# Patient Record
Sex: Male | Born: 1960 | Race: White | Hispanic: No | Smoking: Never smoker
Health system: Southern US, Community
[De-identification: ages and names within clinical notes are randomized; demographics above are authoritative.]

## PROBLEM LIST (undated history)

## (undated) DIAGNOSIS — K469 Unspecified abdominal hernia without obstruction or gangrene: Secondary | ICD-10-CM

## (undated) HISTORY — PX: INGUINAL HERNIA REPAIR: SHX194

## (undated) HISTORY — DX: Unspecified abdominal hernia without obstruction or gangrene: K46.9

## (undated) HISTORY — PX: KNEE SURGERY: SHX244

---

## 2007-12-07 ENCOUNTER — Observation Stay (HOSPITAL_COMMUNITY): Admission: EM | Admit: 2007-12-07 | Discharge: 2007-12-10 | Payer: Self-pay | Admitting: Emergency Medicine

## 2010-05-27 ENCOUNTER — Emergency Department (HOSPITAL_COMMUNITY)
Admission: EM | Admit: 2010-05-27 | Discharge: 2010-05-27 | Payer: Self-pay | Source: Home / Self Care | Admitting: Emergency Medicine

## 2010-05-30 LAB — URINALYSIS, ROUTINE W REFLEX MICROSCOPIC
Bilirubin Urine: NEGATIVE
Ketones, ur: NEGATIVE mg/dL
Nitrite: NEGATIVE
Specific Gravity, Urine: 1.027 (ref 1.005–1.030)
Urobilinogen, UA: 0.2 mg/dL (ref 0.0–1.0)

## 2010-05-30 LAB — URINE MICROSCOPIC-ADD ON

## 2010-05-31 LAB — URINE CULTURE: Colony Count: 7000

## 2010-09-20 NOTE — Cardiovascular Report (Signed)
NAME:  Frank Spencer, Frank Spencer NO.:  [id number]   MEDICAL RECORD NO.:  [id number]          PATIENT TYPE:  INP   LOCATION:  2025                         FACILITY:  [facility]   PHYSICIAN:  Nanetta Batty, M.D.   DATE OF BIRTH:  1960-08-01   DATE OF PROCEDURE:  DATE OF DISCHARGE:                            CARDIAC CATHETERIZATION   Mr. 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  His risk factors include strong family history for  heart disease, but otherwise is negative.  He has had several days of  anginal sounding chest pain.  He was admitted, ruled out for myocardial  infarction with no EKG changes.  Because of his strong family history,  he presents now for diagnostic coronary arteriography to define his  anatomy and rule out ischemic etiology.   DESCRIPTION OF PROCEDURE:  The patient was brought to the second floor  of [facility] in the postabsorptive state.  He was  premedicated with p.o. Valium.  His right groin was prepped and shaved  in the usual sterile fashion.  Xylocaine 1% was used for local  anesthesia.  A 6-French sheath was inserted to the right femoral artery  using a standard Seldinger technique.  A 6-French right and left Judkins  diagnostic catheter as well as 6-French pigtail catheter and Amplatz  Right 1 catheters were used for selective coronary angiography and left  ventriculography respectively.  Visipaque dye was used for the entirety  of the case.  Retrograde aortic, left ventricular, and pullback  pressures were recorded.   HEMODYNAMIC RESULTS:  1. Aortic systolic pressure 122 and diastolic pressure 73.  2. Left ventricular systolic pressure 126 and diastolic pressure 13.   SELECTIVE CORONARY ANGIOGRAPHY:  1. Left main normal.  2. LAD normal.  3. Left circumflex was small and nondominant, normal.  4. Ramus branch was  large and normal.  5. Right coronary artery was dominant and normal.  6. Left ventriculography; RAO left ventriculogram was performed using      25 mL of Visipaque dye at 12 mL per second.  The overall LVEF was      estimated at greater than 50% without focal wall motion      abnormalities.   IMPRESSION:  Mr. Casella has essentially normal coronary arteries and  normal left ventricular function.  There is a question of some  myocardial bridging in the mid left anterior descending, which I do  not think is significant.  I  believe his chest pain is noncardiac.  He will be treated with an  empiric antireflux therapy.  Sheath was removed and pressures on the  groin to achieve hemostasis.  The patient left the lab in stable  condition.  He will be discharged later today and will see Dr. Charolette Child back in followup.  He left the lab in stable condition.      Nanetta Batty, M.D.  Electronically Signed     JB/MEDQ  D:  12/09/2007  T:  12/10/2007  Job:  [id number]   cc:   [facility]  Second Floor [facility]  Antionette Char, MD

## 2010-09-20 NOTE — H&P (Signed)
NAME:  ZENITH, LAMPHIER NO.:  [id number]   MEDICAL RECORD NO.:  [id number]          PATIENT TYPE:  INP   LOCATION:  2025                         FACILITY:  [facility]   PHYSICIAN:  Nanetta Batty, M.D.   DATE OF BIRTH:  [birth year]   DATE OF ADMISSION:  12/07/2007  DATE OF DISCHARGE:                              HISTORY & PHYSICAL   CHIEF COMPLAINT:  Chest discomfort.   HISTORY OF PRESENT ILLNESS:  Mr. Tatar is a 50 year old male who has  been followed on long term by Dr. Aleen Campi.  He has had semiannual  treadmill test.  Dr. Aleen Campi follows Mr. Detjen's father who has had a  history of early coronary artery disease having had a bypass in his 8s  by Dr. __________ in [place].  Mr. Pieper himself has no history of  coronary artery disease or any serious medical problems.  He is on no  medications.  He is very active, he plays soccer.  He recently was laid  off work Wednesday this past week from his job in [employer].  On Thursday, he noted a vague ache in his left chest, which  seemed to radiate to his left shoulder.  He was not particularly  exacerbated by exertion.  He has had discomfort off and on since then.  Today, he said he felt dizzy after walking up and down flight of stairs.  He says overall he has not felt like myself.  Because of his family  history, he was concerned and came to the emergency room.  He continues  to have some vague left ache in his chest.  He denies any shortness of  breath, diaphoresis, or radiation of symptoms to his jaw or back.   His past medical history is unremarkable for hypertension, diabetes, or  dyslipidemia.  He has had previous hernia repair and knee surgery.   His current medications are none.   He has no known drug allergies.   SOCIAL HISTORY:  He is single.  He has no children.  He is a nonsmoker.   His family history is remarkable as noted above, his father had bypass  surgery in [place] in his 90s.   REVIEW OF SYSTEMS:  Essentially unremarkable except for noted above.  He  denies any palpitations, tachycardia, or syncope.  His EKG shows sinus  rhythm without acute changes.   PHYSICAL EXAMINATION:  VITAL SIGNS:  Blood pressure 150/86, pulse 90,  temperature 98.3, and respirations 12.  GENERAL:  He is a well-developed, well-nourished male in no acute  distress.  HEENT:  Normocephalic and atraumatic.  Extraocular movements are intact.  Sclerae are nonicteric.  Lids and conjunctivae are within normal limits.  NECK:  Without bruit or JVD.  His thyroid is not enlarged.  Chest:  Clear to auscultation and percussion.  CARDIAC:  Regular rate and rhythm without murmur, rub, or gallop.  Normal S1 and S2.  ABDOMEN:  Nontender.  No hepatosplenomegaly.  He does have a bifemoral  hernia repair scar.  EXTREMITIES:  Without edema.  He has a surgical scar on  his right knee.  Distal pulses are 2+/4 bilaterally.  There are no femoral artery bruits.  NEURO:  Grossly intact.  He is awake, alert, oriented, and cooperative.  He moves all extremities without obvious deficits.   Initial labs show troponin negative.  Sodium 139, potassium 4.2, BUN 13,  creatinine 1.4, and glucose 121.  White count 5.4, hemoglobin 14.1,  hematocrit 41.8, and platelets 245.  Chest x-ray shows no active  disease.   IMPRESSION:  1. Chest pain, worrisome for unstable angina.  2. Strong family history of coronary artery disease.   PLAN:  The patient was seen by Dr. Allyson Sabal and myself in the emergency  room.  He will be admitted to telemetry and rule out for an MI.  He will  probably require diagnostic catheterization, which could be done on  Monday.      Abelino Derrick, P.A.      Nanetta Batty, M.D.  Electronically Signed    LKK/MEDQ  D:  12/07/2007  T:  12/08/2007  Job:  [id number]

## 2010-09-20 NOTE — Discharge Summary (Signed)
NAME:  Frank Spencer, MAHAFFY NO.:  [id number]   MEDICAL RECORD NO.:  [id number]          PATIENT TYPE:  OBV   LOCATION:  2025                         FACILITY:  [facility]   PHYSICIAN:  Nanetta Batty, M.D.   DATE OF BIRTH:  December 24, 1960   DATE OF ADMISSION:  12/07/2007  DATE OF DISCHARGE:  12/10/2007                               DISCHARGE SUMMARY   DISCHARGE DIAGNOSES:  1. Chest pain worsened for unstable angina, normal coronaries, and      normal LV function on catheterization at this admission.  2. Strong family history of coronary artery disease.   HOSPITAL COURSE:  Mr. Wasco is a 50 year old male followed by Dr.  Aleen Campi.  He has had previous negative treadmills.  He does have a  strong family history of coronary artery disease.  Recently, he was laid  off.  The day after, he noticed some chest discomfort.  The symptoms  gradually waxed and waned, and then worsened when he had some dizziness  walking upstairs on December 07, 2007.  He presented to the emergency room.  His EKG was without acute changes.  He was admitted to telemetry and  setup for diagnostic catheterization, which would done on December 09, 2007.  This revealed normal coronaries and normal LV function.  Dr.  Allyson Sabal feels, he can be discharged this morning on December 10, 2007, if  stable.  He can follow up with Dr. Aleen Campi.   DISCHARGE MEDICATION:  Prilosec p.r.n.   LABORATORY DATA:  INR is 1.0.  TSH 0.68.  CK-MB and troponins were  negative.  Cholesterol was 174, LDL 118, and HDL 32.  Liver functions  are  normal.  Sodium 139, potassium 4.4, BUN 10, and creatinine 1.3.  White count 9.7, hemoglobin 14.4, hematocrit 42.9, and platelets 226.   DISPOSITION:  The patient was discharged in stable condition and will  follow up with Dr. Aleen Campi.  He has been instructed to go on a low-  cholesterol diet.  His lipids may need to be rechecked in a year or so,  because of his family history.   ADDENDUM:  Chest x-ray  shows no acute findings.      Abelino Derrick, P.A.      Nanetta Batty, M.D.  Electronically Signed    LKK/MEDQ  D:  12/10/2007  T:  12/11/2007  Job:  [id number]   cc:   Antionette Char, MD

## 2011-02-03 LAB — COMPREHENSIVE METABOLIC PANEL
ALT: 22
AST: 21
AST: 22
Albumin: 4.1
Albumin: 4.4
Alkaline Phosphatase: 56
CO2: 30
Calcium: 9.8
Chloride: 102
Creatinine, Ser: 1.15
GFR calc Af Amer: >60
GFR calc Af Amer: >60
GFR calc non Af Amer: >60
Potassium: 4.4
Sodium: 139
Total Bilirubin: 0.6

## 2011-02-03 LAB — DIFFERENTIAL
Basophils Absolute: 0
Basophils Absolute: 0.1
Basophils Relative: 0
Basophils Relative: 1
Eosinophils Relative: 0
Monocytes Absolute: 0.4
Monocytes Relative: 4
Neutro Abs: 3.5
Neutrophils Relative %: 64

## 2011-02-03 LAB — POCT CARDIAC MARKERS
CKMB, poc: <1 — ABNORMAL LOW
Myoglobin, poc: 83.1
Troponin i, poc: <0.05
Troponin i, poc: <0.05

## 2011-02-03 LAB — CARDIAC PANEL(CRET KIN+CKTOT+MB+TROPI)
CK, MB: 0.9
CK, MB: 1
CK, MB: 1.1
Relative Index: INVALID
Relative Index: INVALID
Total CK: 66
Total CK: 68
Total CK: 68
Troponin I: 0.01
Troponin I: 0.01

## 2011-02-03 LAB — LIPID PANEL
LDL Cholesterol: 118 — ABNORMAL HIGH
Triglycerides: 118

## 2011-02-03 LAB — CK TOTAL AND CKMB (NOT AT ARMC): Relative Index: INVALID

## 2011-02-03 LAB — POCT I-STAT, CHEM 8
Chloride: 102
HCT: 43
Potassium: 4.2

## 2011-02-03 LAB — CBC
HCT: 42.9
MCHC: 33.8
Platelets: 266
RDW: 13.1
WBC: 9.7

## 2011-02-03 LAB — PROTIME-INR: Prothrombin Time: 13.3

## 2012-12-27 ENCOUNTER — Encounter (HOSPITAL_COMMUNITY): Payer: Self-pay

## 2012-12-27 ENCOUNTER — Emergency Department (HOSPITAL_COMMUNITY): Payer: PRIVATE HEALTH INSURANCE

## 2012-12-27 ENCOUNTER — Emergency Department (HOSPITAL_COMMUNITY)
Admission: EM | Admit: 2012-12-27 | Discharge: 2012-12-27 | Disposition: A | Discharge status: Home / Self Care | Payer: PRIVATE HEALTH INSURANCE | Attending: Emergency Medicine | Admitting: Emergency Medicine

## 2012-12-27 DIAGNOSIS — R079 Chest pain, unspecified: Secondary | ICD-10-CM

## 2012-12-27 DIAGNOSIS — R071 Chest pain on breathing: Secondary | ICD-10-CM | POA: Insufficient documentation

## 2012-12-27 DIAGNOSIS — R51 Headache: Secondary | ICD-10-CM | POA: Insufficient documentation

## 2012-12-27 LAB — CBC WITH DIFFERENTIAL/PLATELET
Basophils Absolute: 0 10*3/uL (ref 0.0–0.1)
Basophils Relative: 0 % (ref 0–1)
Eosinophils Absolute: 0.1 10*3/uL (ref 0.0–0.7)
Eosinophils Relative: 2 % (ref 0–5)
Lymphs Abs: 2.1 10*3/uL (ref 0.7–4.0)
MCH: 32.4 pg (ref 26.0–34.0)
Neutrophils Relative %: 56 % (ref 43–77)
Platelets: 226 10*3/uL (ref 150–400)
RBC: 4.6 MIL/uL (ref 4.22–5.81)
RDW: 12.8 % (ref 11.5–15.5)

## 2012-12-27 LAB — COMPREHENSIVE METABOLIC PANEL
ALT: 21 U/L (ref 0–53)
AST: 20 U/L (ref 0–37)
Albumin: 3.9 g/dL (ref 3.5–5.2)
Alkaline Phosphatase: 53 U/L (ref 39–117)
Calcium: 9.9 mg/dL (ref 8.4–10.5)
Glucose, Bld: 112 mg/dL — ABNORMAL HIGH (ref 70–99)
Potassium: 3.7 mEq/L (ref 3.5–5.1)
Sodium: 137 mEq/L (ref 135–145)
Total Protein: 8 g/dL (ref 6.0–8.3)

## 2012-12-27 NOTE — Discharge Instructions (Signed)
Read the information below.  You may return to the Emergency Department at any time for worsening condition or any new symptoms that concern you.  Your caregiver has diagnosed you as having chest pain that is not specific for one problem, but does not require admission.  You are at low risk for an acute heart condition or other serious illness. Chest pain comes from many different causes.  SEEK IMMEDIATE MEDICAL ATTENTION IF: You have severe chest pain, especially if the pain is crushing or pressure-like and spreads to the arms, back, neck, or jaw, or if you have sweating, nausea (feeling sick to your stomach), or shortness of breath. THIS IS AN EMERGENCY. Don't wait to see if the pain will go away. Get medical help at once. Call 911 or 0 (operator). DO NOT drive yourself to the hospital.  Your chest pain gets worse and does not go away with rest.  You have an attack of chest pain lasting longer than usual, despite rest and treatment with the medications your caregiver has prescribed.  You wake from sleep with chest pain or shortness of breath.  You feel dizzy or faint.  You have chest pain not typical of your usual pain for which you originally saw your caregiver.   Chest Pain (Nonspecific) It is often hard to give a specific diagnosis for the cause of chest pain. There is always a chance that your pain could be related to something serious, such as a heart attack or a blood clot in the lungs. You need to follow up with your caregiver for further evaluation. CAUSES   Heartburn.  Pneumonia or bronchitis.  Anxiety or stress.  Inflammation around your heart (pericarditis) or lung (pleuritis or pleurisy).  A blood clot in the lung.  A collapsed lung (pneumothorax). It can develop suddenly on its own (spontaneous pneumothorax) or from injury (trauma) to the chest.  Shingles infection (herpes zoster virus). The chest wall is composed of bones, muscles, and cartilage. Any of these can be the  source of the pain.  The bones can be bruised by injury.  The muscles or cartilage can be strained by coughing or overwork.  The cartilage can be affected by inflammation and become sore (costochondritis). DIAGNOSIS  Lab tests or other studies, such as X-rays, electrocardiography, stress testing, or cardiac imaging, may be needed to find the cause of your pain.  TREATMENT   Treatment depends on what may be causing your chest pain. Treatment may include:  Acid blockers for heartburn.  Anti-inflammatory medicine.  Pain medicine for inflammatory conditions.  Antibiotics if an infection is present.  You may be advised to change lifestyle habits. This includes stopping smoking and avoiding alcohol, caffeine, and chocolate.  You may be advised to keep your head raised (elevated) when sleeping. This reduces the chance of acid going backward from your stomach into your esophagus.  Most of the time, nonspecific chest pain will improve within 2 to 3 days with rest and mild pain medicine. HOME CARE INSTRUCTIONS   If antibiotics were prescribed, take your antibiotics as directed. Finish them even if you start to feel better.  For the next few days, avoid physical activities that bring on chest pain. Continue physical activities as directed.  Do not smoke.  Avoid drinking alcohol.  Only take over-the-counter or prescription medicine for pain, discomfort, or fever as directed by your caregiver.  Follow your caregiver's suggestions for further testing if your chest pain does not go away.  Keep any follow-up  appointments you made. If you do not go to an appointment, you could develop lasting (chronic) problems with pain. If there is any problem keeping an appointment, you must call to reschedule. SEEK MEDICAL CARE IF:   You think you are having problems from the medicine you are taking. Read your medicine instructions carefully.  Your chest pain does not go away, even after  treatment.  You develop a rash with blisters on your chest. SEEK IMMEDIATE MEDICAL CARE IF:   You have increased chest pain or pain that spreads to your arm, neck, jaw, back, or abdomen.  You develop shortness of breath, an increasing cough, or you are coughing up blood.  You have severe back or abdominal pain, feel nauseous, or vomit.  You develop severe weakness, fainting, or chills.  You have a fever. THIS IS AN EMERGENCY. Do not wait to see if the pain will go away. Get medical help at once. Call your local emergency services (911 in [place] Do not drive yourself to the hospital. MAKE SURE YOU:   Understand these instructions.  Will watch your condition.  Will get help right away if you are not doing well or get worse. Document Released: 02/01/2005 Document Revised: 07/17/2011 Document Reviewed: 11/28/2007 [facility] Patient Information 2014 [place], [place].

## 2012-12-27 NOTE — ED Notes (Signed)
Pt. Reports having lt. Lateral chest pain , under lt. Nipples radiates under lt. Arm pit.  Pain began yesterday constant, denies any sob ,n/v. Skin is p/w/d a   Nothing changes the pain .

## 2012-12-27 NOTE — ED Provider Notes (Signed)
Medical screening examination/treatment/procedure(s) were conducted as a shared visit with non-physician practitioner(s) and myself.  I personally evaluated the patient during the encounter  Subjective: Patient here complaining of constant chest pain x1 day. Does have a history of negative cardiac catheterization 5 years ago and has not had any associated symptoms of dyspnea diaphoresis associated with his symptoms.  Objective: Afebrile vital signs stable. EKG without ischemic changes.  Assessment/plan: Patient stable for discharge with negative workup here. Doubt that patient has ACS.  Toy Baker, MD 12/27/12 [phone]

## 2012-12-27 NOTE — ED Provider Notes (Signed)
CSN: [id number]     Arrival date & time 12/27/12  1151 History     First MD Initiated Contact with Patient 12/27/12 1205     Chief Complaint  Patient presents with  . Chest Pain   (Consider location/radiation/quality/duration/timing/severity/associated sxs/prior Treatment) HPI Comments: Pt reports yesterday two hours after lunch began having sensation of indigestion in his midchest, later in the day developed "tenderness" in his left side (under his left axilla) with radiation into left lower chest wall.  States it feels like muscle soreness from working out, but because he has a terrible family history, he wanted to get it checked out.  States the pain has been intermittent since yesterday.  He woke up with headache today, which is unusual for him (bilateral temples, tightness).  He took aspirin without relief of either pain.  Denies recent illness, denies SOB, nausea, lightheadedness, abdominal pain, vomiting.  Pt has normal cardiac cath with [facility] in 2009 but has very significant family hx CAD on both sides of his family.  PCP is Merri Brunette - had normal full physical and normal labs and cholesterol levels 2 months ago.    Patient is a 52 y.o. male presenting with chest pain. The history is provided by the patient.  Chest Pain Associated symptoms: no abdominal pain, no back pain, no cough, no dizziness, no fever, no nausea, no shortness of breath and not vomiting     History reviewed. No pertinent past medical history. History reviewed. No pertinent past surgical history. History reviewed. No pertinent family history. History  Substance Use Topics  . Smoking status: Never Smoker   . Smokeless tobacco: Not on file  . Alcohol Use: No    Review of Systems  Constitutional: Negative for fever and chills.  HENT: Negative for neck pain.   Respiratory: Negative for cough and shortness of breath.   Cardiovascular: Positive for chest pain.  Gastrointestinal: Negative for nausea, vomiting,  abdominal pain and diarrhea.  Genitourinary: Negative for dysuria, urgency and frequency.  Musculoskeletal: Negative for back pain.  Skin: Negative for rash.  Neurological: Negative for dizziness and light-headedness.    Allergies  Review of patient's allergies indicates not on file.  Home Medications  No current outpatient prescriptions on file. BP 143/91  Pulse 94  Temp(Src) 97.7 F (36.5 C) (Oral)  Resp 22  Ht 6' (1.829 m)  Wt 168 lb (76.204 kg)  BMI 22.78 kg/m2  SpO2 97% Physical Exam  Nursing note and vitals reviewed. Constitutional: He appears well-developed and well-nourished. No distress.  HENT:  Head: Normocephalic and atraumatic.  Neck: Neck supple.  Cardiovascular: Normal rate, regular rhythm and intact distal pulses.   Pulmonary/Chest: Effort normal and breath sounds normal. No respiratory distress. He has no wheezes. He has no rales. He exhibits no tenderness.  Abdominal: Soft. He exhibits no distension and no mass. There is no tenderness. There is no rebound and no guarding.  Neurological: He is alert. He exhibits normal muscle tone.  Skin: No rash noted. He is not diaphoretic.    ED Course   Procedures (including critical care time)  Labs Reviewed  CBC WITH DIFFERENTIAL - Abnormal; Notable for the following:    MCHC 36.3 (*)    All other components within normal limits  COMPREHENSIVE METABOLIC PANEL - Abnormal; Notable for the following:    Glucose, Bld 112 (*)    GFR calc non Af Amer 70 (*)    GFR calc Af Amer 81 (*)    All other  components within normal limits  POCT I-STAT TROPONIN I   Dg Chest 2 View  12/27/2012   *RADIOLOGY REPORT*  Clinical Data:  heartburn, indigestion  CHEST - 2 VIEW  Comparison:  12/07/2007  Findings: Cardiomediastinal silhouette is stable.  No acute infiltrate or pleural effusion.  No pulmonary edema.  Bony thorax is unremarkable.  Mild hyperinflation again noted.  IMPRESSION: No active disease.  No significant change.    Original Report Authenticated By: Natasha Mead, M.D.    1:30 PM Discussed patient with Dr Freida Busman.    Date: 12/27/2012  Rate: 89  Rhythm: normal sinus rhythm  QRS Axis: normal  Intervals: normal  ST/T Wave abnormalities: normal  Conduction Disutrbances: none  Narrative Interpretation:   Old EKG Reviewed: unavailable    1. Chest pain     MDM  Pt with chest pain that began yesterday, described as both indigestion and muscle soreness - has started working out again recently after several week break in routine.  Pt works out several times a week usually and does not have chest pain or SOB. Pt also seen by Dr Freida Busman.  Pt d/c home with PCP follow up.  Results and plan discussed with patient by Dr Freida Busman.  Return precautions given.    Trixie Dredge, PA-C 12/27/12 1719

## 2012-12-28 NOTE — ED Provider Notes (Signed)
Medical screening examination/treatment/procedure(s) were performed by non-physician practitioner and as supervising physician I was immediately available for consultation/collaboration.  Unika Nazareno T Hasnain Manheim, MD 12/28/12 1212 

## 2013-01-28 ENCOUNTER — Encounter: Payer: Self-pay | Admitting: Cardiology

## 2013-01-28 ENCOUNTER — Ambulatory Visit (INDEPENDENT_AMBULATORY_CARE_PROVIDER_SITE_OTHER): Payer: PRIVATE HEALTH INSURANCE | Admitting: Cardiology

## 2013-01-28 VITALS — BP 120/86 | Ht 72.0 in | Wt 172.4 lb

## 2013-01-28 DIAGNOSIS — Z8249 Family history of ischemic heart disease and other diseases of the circulatory system: Secondary | ICD-10-CM

## 2013-01-28 DIAGNOSIS — R079 Chest pain, unspecified: Secondary | ICD-10-CM

## 2013-01-28 NOTE — Patient Instructions (Signed)
Your physician wants you to follow-up in Sept 2015You will receive a reminder letter in the mail two months in advance. If you don't receive a letter, please call our office to schedule the follow-up appointment.  CPET - BICYCLE TEST

## 2013-02-02 ENCOUNTER — Encounter: Payer: Self-pay | Admitting: Cardiology

## 2013-02-02 DIAGNOSIS — R079 Chest pain, unspecified: Secondary | ICD-10-CM | POA: Insufficient documentation

## 2013-02-02 DIAGNOSIS — Z8249 Family history of ischemic heart disease and other diseases of the circulatory system: Secondary | ICD-10-CM | POA: Insufficient documentation

## 2013-02-02 NOTE — Assessment & Plan Note (Signed)
Most likely just based on the fact he's not had any more symptoms, and is back to doing his regular activities, and this is contributing noncardiac issue.\ I do think that with his family history and his level of concern, we should do something to risk stratify him. The best test to use the Cardiopulmonary Exercise Test (CPET). Will use this for risk stratification in order to determine the intensity of the treatment.

## 2013-02-02 NOTE — Assessment & Plan Note (Addendum)
He is quite concerned about his family history. He hasn't really taken on telemetry there lifestyle from his family (he exercises, and does not smoke). This is by far the best thing he can do to reduce his family risk his stay help himself. His lipid panel was pretty good for his risk factors.

## 2013-02-02 NOTE — Progress Notes (Signed)
CARDIOLOGY CONSULTATION NOTE:  PCP: Londell Moh, MD  Clinic Note: Chief Complaint  Patient presents with  . New Evaluation    re-establish with cardiologist, AUG 2014- BAD CASE INDIGIDESTION WENT TO ER,no sb, no swelling ,no chest pain, multi cardiac test no MI   HPI: Frank Spencer is a 52 y.o. male with a PMH below who presents today for followup of recent emergency room visit.  Interval History: Mr. Frank Spencer was a former patient of Dr. Aleen Campi and gross secondary to his significant family history of coronary disease. His father and at heart disease and died at age 21 for premature heart disease.  He says his maternal grandfather also had some cardiac problems with aneurysm. He himself has no real cardiac risk factors of hypertension, hyperlipidemia or diabetes. He is generally in very good shape exercises pretty much to 4 days a week. He apparently actually had a heart catheterization in 2009 with no evidence of coronary disease. About a month ago,(12/27/2012) he presented emergency room with an episode of discomfort in the left side of his chest under his axilla and left lower chest wall. It began in mid chest. He describes a tenderness sensation. It began several hours after lunch. He stated felt like generalized muscle soreness but due to his family history decided to have it checked out. He had intermittent chest pain for couple days. He then also noted with nipple the headache bilateral temporal discomfort, which is very unusual for him. He then noted some mild palpitations. Due to his conglomeration of symptoms, he decided to go to emergency room. He was ruled out for MI and discharged with a diagnosis of atypical chest pain, most likely musculoskeletal. Now presents for post ER followup.    Since his ER visit, his eye any further episodes of chest discomfort with rest or exertion. He did note below with exertional dyspnea for a few days after this episode, but none since.  The  remainder of Cardiovascular ROS is as follows:: no chest pain or dyspnea on exertion negative for - edema, irregular heartbeat, loss of consciousness, murmur, orthopnea, palpitations, paroxysmal nocturnal dyspnea, rapid heart rate or shortness of breath  Additional cardiac review of systems: Lightheadedness - no, dizziness - no, syncope/near-syncope - no; TIA/amaurosis fugax - no Melena - no, hematochezia no; hematuria - no; nosebleeds - no; claudication - no    No past medical history on file.  Prior Cardiac Evaluation and Past Surgical History: No past surgical history on file.    Allergies  Allergen Reactions  . Codeine Nausea Only and Other (See Comments)    LIGHTHEADED    Current Outpatient Prescriptions  Medication Sig Dispense Refill  . b complex vitamins tablet Take 1 tablet by mouth daily.      Marland Kitchen BEE POLLEN PO Take 1 tablet by mouth daily.      . beta carotene 16109 UNIT capsule Take 25,000 Units by mouth daily.      . Cranberry 250 MG TABS Take 250 mg by mouth daily.      . Ginkgo Biloba 60 MG TABS Take by mouth.      Marland Kitchen GRAPE SEED EXTRACT PO Take 1 capsule by mouth daily.      Chilton Si Tea, Camillia sinensis, (GREEN TEA EXTRACT PO) Take 1 tablet by mouth daily.      . Multiple Vitamin (MULTIVITAMIN WITH MINERALS) TABS tablet Take 1 tablet by mouth daily.      . Multiple Vitamins-Minerals (ZINC PO) Take 1 tablet  by mouth every other day.      . niacin 500 MG tablet Take 500 mg by mouth daily with breakfast.      . OVER THE COUNTER MEDICATION GARLIC AND PARSLEY  TABLET DAILY      . OVER THE COUNTER MEDICATION CELERY SEED 600 MG DAILY      . OVER THE COUNTER MEDICATION GINGSENG 1000 MG  TAKE EVERY OTHER DAY      . Zinc 25 MG TABS Take by mouth every other day.       No current facility-administered medications for this visit.    History   Social History Narrative  . No narrative on file    ROS: A comprehensive Review of Systems - Negative except The episode at the  emergency room and mild dyspnea. Otherwise negative  PHYSICAL EXAM BP 120/86  Ht 6' (1.829 m)  Wt 172 lb 6.4 oz (78.2 kg)  BMI 23.38 kg/m2 General appearance: alert, cooperative, appears stated age, no distress and Healthy appearing. Well nourished and well groomed. Answers questions properly. Neck: no adenopathy, no carotid bruit, no JVD and supple, symmetrical, trachea midline Lungs: clear to auscultation bilaterally, normal percussion bilaterally and Nonlabored, good air movement Heart: normal apical impulse, irregularly irregular rhythm, S1, S2 normal, no S3 or S4 and No murmurs or rubs Abdomen: soft, non-tender; bowel sounds normal; no masses,  no organomegaly Extremities: extremities normal, atraumatic, no cyanosis or edema Pulses: 2+ and symmetric Skin: Skin color, texture, turgor normal. No rashes or lesions Neurologic: Alert and oriented X 3, normal strength and tone. Normal symmetric reflexes. Normal coordination and gait; CN II - XII grossly intact  HEENT: [place]/AT, EOMI, MMM, anicteric sclera  WUJ:[id number] today: Yes Rate: 73  , Rhythm: NSR,  normal ECG;   Recent Labs: May 2014 from PCP  TC 177, TG 126  HDL 37 LDL 115  ASSESSMENT / PLAN: Overall relatively healthy 52 year old gentleman with a family history but nothing dramatic as far as cardiac history. The best I can tell no true premature disease. He is here to reestablish care from her cardiologist as he has not seen one in years, since his last cardiologist retired.  No problem-specific assessment & plan notes found for this encounter.   Orders Placed This Encounter  Procedures  . CPET ONLY (MET TEST)    Standing Status: Future     Number of Occurrences:      Standing Expiration Date: 01/28/2014    Scheduling Instructions:     Chest pain at rest     Family hx disease  . EKG 12-Lead    Scheduling Instructions:     [facility] [employer] Specific Question:  Where should this test  be performed    Answer:  OTHER    Followup:  one year, unless CPET is abnormal   DAVID W. Herbie Baltimore, M.D., M.S. [facility] [address] [place], [place]  [address]  [phone] Pager # [phone]

## 2014-07-17 ENCOUNTER — Encounter (HOSPITAL_COMMUNITY): Payer: Self-pay | Admitting: Emergency Medicine

## 2014-07-17 ENCOUNTER — Emergency Department (HOSPITAL_COMMUNITY): Payer: 59

## 2014-07-17 ENCOUNTER — Emergency Department (HOSPITAL_COMMUNITY)
Admission: EM | Admit: 2014-07-17 | Discharge: 2014-07-18 | Disposition: A | Discharge status: Home / Self Care | Payer: 59 | Attending: Emergency Medicine | Admitting: Emergency Medicine

## 2014-07-17 DIAGNOSIS — N1330 Unspecified hydronephrosis: Secondary | ICD-10-CM | POA: Insufficient documentation

## 2014-07-17 DIAGNOSIS — Z8719 Personal history of other diseases of the digestive system: Secondary | ICD-10-CM | POA: Insufficient documentation

## 2014-07-17 DIAGNOSIS — Z79899 Other long term (current) drug therapy: Secondary | ICD-10-CM | POA: Insufficient documentation

## 2014-07-17 DIAGNOSIS — N201 Calculus of ureter: Secondary | ICD-10-CM | POA: Insufficient documentation

## 2014-07-17 DIAGNOSIS — N200 Calculus of kidney: Secondary | ICD-10-CM | POA: Diagnosis present

## 2014-07-17 LAB — BASIC METABOLIC PANEL
ANION GAP: 12 (ref 5–15)
BUN: 13 mg/dL (ref 6–23)
CHLORIDE: 99 mmol/L (ref 96–112)
CO2: 23 mmol/L (ref 19–32)
Calcium: 9.5 mg/dL (ref 8.4–10.5)
Creatinine, Ser: 1.49 mg/dL — ABNORMAL HIGH (ref 0.50–1.35)
GFR calc non Af Amer: 51 mL/min — ABNORMAL LOW (ref >90)
GFR, EST AFRICAN AMERICAN: 59 mL/min — AB (ref >90)
Glucose, Bld: 146 mg/dL — ABNORMAL HIGH (ref 70–99)
POTASSIUM: 4.7 mmol/L (ref 3.5–5.1)
SODIUM: 134 mmol/L — AB (ref 135–145)

## 2014-07-17 LAB — CBC
HCT: 41.1 % (ref 39.0–52.0)
Hemoglobin: 14.3 g/dL (ref 13.0–17.0)
MCH: 32 pg (ref 26.0–34.0)
MCHC: 34.8 g/dL (ref 30.0–36.0)
MCV: 91.9 fL (ref 78.0–100.0)
Platelets: 269 10*3/uL (ref 150–400)
RBC: 4.47 MIL/uL (ref 4.22–5.81)
RDW: 12.4 % (ref 11.5–15.5)
WBC: 15 10*3/uL — ABNORMAL HIGH (ref 4.0–10.5)

## 2014-07-17 LAB — URINE MICROSCOPIC-ADD ON

## 2014-07-17 LAB — URINALYSIS, ROUTINE W REFLEX MICROSCOPIC
BILIRUBIN URINE: NEGATIVE
GLUCOSE, UA: NEGATIVE mg/dL
KETONES UR: 15 mg/dL — AB
Leukocytes, UA: NEGATIVE
Nitrite: NEGATIVE
PH: 5.5 (ref 5.0–8.0)
Protein, ur: NEGATIVE mg/dL
Specific Gravity, Urine: 1.024 (ref 1.005–1.030)
Urobilinogen, UA: 0.2 mg/dL (ref 0.0–1.0)

## 2014-07-17 MED ORDER — FENTANYL CITRATE 0.05 MG/ML IJ SOLN
50.0000 ug | Freq: Once | INTRAMUSCULAR | Status: DC
Start: 2014-07-17 — End: 2014-07-17
  Administered 2014-07-17: 50 ug via INTRAVENOUS
  Filled 2014-07-17: qty 2

## 2014-07-17 MED ORDER — SODIUM CHLORIDE 0.9 % IV BOLUS (SEPSIS)
1000.0000 mL | Freq: Once | INTRAVENOUS | Status: AC
  Administered 2014-07-17: 1000 mL via INTRAVENOUS

## 2014-07-17 MED ORDER — ONDANSETRON HCL 4 MG/2ML IJ SOLN
4.0000 mg | Freq: Once | INTRAMUSCULAR | Status: AC
  Administered 2014-07-17: 4 mg via INTRAVENOUS
  Filled 2014-07-17: qty 2

## 2014-07-17 MED ORDER — SODIUM CHLORIDE 0.9 % IV BOLUS (SEPSIS): 500.0000 mL | Freq: Once | INTRAVENOUS | Status: DC

## 2014-07-17 MED ORDER — MORPHINE SULFATE 4 MG/ML IJ SOLN
4.0000 mg | Freq: Once | INTRAMUSCULAR | Status: AC
  Administered 2014-07-17: 4 mg via INTRAVENOUS
  Filled 2014-07-17: qty 1

## 2014-07-17 NOTE — ED Notes (Signed)
Pt reports hx of kidney stones. States he just passed one a month ago and is sure he has another today.

## 2014-07-17 NOTE — ED Provider Notes (Signed)
CSN: [id number]     Arrival date & time 07/17/14  2014 History   First MD Initiated Contact with Patient 07/17/14 2030     Chief Complaint  Patient presents with  . Nephrolithiasis     (Consider location/radiation/quality/duration/timing/severity/associated sxs/prior Treatment) HPI Comments: 54 year old male past medical history of multiple kidney stones presenting with sudden onset right-sided flank pain beginning around 9:00 AM today, lasting an hour and a half and subsiding on its own. Pain has returned twice since, described as sharp and throbbing, currently just a dull, achy pain. No aggravating or alleviating factors. Admits to associated nausea and vomiting. States he has been unable to keep anything down today. Denies fever or chills. Denies urinary symptoms. Last kidney stone confirmed was one month ago, however this was on the left side. He's had a few very large kidney stones which required lithotripsy. Has not seen his urologist Dr. Patsi Searsannenbaum in a while. One month ago, he [place]rolog[place] in [place] as that is where he was working.  The history is provided by the patient.    Past Medical History  Diagnosis Date  . Hernia      initial repair in 1980, redo repair in May 2006.    Past Surgical History  Procedure Laterality Date  . Inguinal hernia repair   1980,  May 2006   . Knee surgery Bilateral 1994, 2008     Right knee in June and August of 1994, left knee October 1998   Family History  Problem Relation Age of Onset  . Heart disease Father [id number]  . Aneurysm Maternal Grandfather 57    Aneurysm  . Pneumonia Paternal Grandfather 6938    Pneumonia   History  Substance Use Topics  . Smoking status: Never Smoker   . Smokeless tobacco: Not on file  . Alcohol Use: Yes     Comment: occas beer or wine    Review of Systems  Gastrointestinal: Positive for nausea and vomiting.  Genitourinary: Positive for flank pain.  All other systems reviewed and are  negative.     Allergies  Codeine  Home Medications   Prior to Admission medications   Medication Sig Start Date End Date Taking? Authorizing Provider  b complex vitamins tablet Take 1 tablet by mouth daily.   Yes Historical Provider, MD  BEE POLLEN PO Take 1 tablet by mouth daily.   Yes Historical Provider, MD  beta carotene [id number] UNIT capsule Take 25,000 Units by mouth daily.   Yes Historical Provider, MD  Cranberry 250 MG TABS Take 250 mg by mouth daily.   Yes Historical Provider, MD  Ginkgo Biloba 60 MG TABS Take by mouth.   Yes Historical Provider, MD  GRAPE SEED EXTRACT PO Take 1 capsule by mouth daily.   Yes Historical Provider, MD  Chilton SiGreen Tea, Camillia sinensis, (GREEN TEA EXTRACT PO) Take 1 tablet by mouth daily.   Yes Historical Provider, MD  Multiple Vitamin (MULTIVITAMIN WITH MINERALS) TABS tablet Take 1 tablet by mouth daily.   Yes Historical Provider, MD  Multiple Vitamins-Minerals (ZINC PO) Take 1 tablet by mouth every other day.   Yes Historical Provider, MD  niacin 500 MG tablet Take 500 mg by mouth daily with breakfast.   Yes Historical Provider, MD  OVER THE COUNTER MEDICATION GARLIC AND PARSLEY  TABLET DAILY   Yes Historical Provider, MD  OVER THE COUNTER MEDICATION CELERY SEED 600 MG DAILY   Yes Historical Provider, MD  OVER THE COUNTER MEDICATION GINGSENG 1000 MG  TAKE EVERY OTHER DAY   Yes Historical Provider, MD  oxyCODONE-acetaminophen (PERCOCET/ROXICET) 5-325 MG per tablet Take 1 tablet by mouth every 6 (six) hours as needed for moderate pain.  06/26/14  Yes Historical Provider, MD  Zinc 25 MG TABS Take by mouth every other day.   Yes Historical Provider, MD  ondansetron (ZOFRAN ODT) 4 MG disintegrating tablet  ODT q4 hours prn nausea/vomit 07/18/14   Nejla Reasor M Charleene Callegari, PA-C  oxyCODONE-acetaminophen (PERCOCET) 5-325 MG per tablet Take 1-2 tablets by mouth every 6 (six) hours as needed for severe pain. 07/18/14   Kathrynn Speed, PA-C  tamsulosin (FLOMAX) 0.4 MG CAPS  capsule Take 1 capsule (0.4 mg total) by mouth daily. 07/18/14   Lunell Robart M Quaneshia Wareing, PA-C   BP 117/80 mmHg  Pulse 101  Temp(Src) 97.9 F (36.6 C) (Oral)  Resp 16  SpO2 94% Physical Exam  Constitutional: He is oriented to person, place, and time. He appears well-developed and well-nourished. No distress.  HENT:  Head: Normocephalic and atraumatic.  Eyes: Conjunctivae and EOM are normal.  Neck: Normal range of motion. Neck supple.  Cardiovascular: Normal rate, regular rhythm and normal heart sounds.   Pulmonary/Chest: Effort normal and breath sounds normal.  Abdominal: Soft. Bowel sounds are normal. He exhibits no distension. There is no rebound.  R CVAT.  Musculoskeletal: Normal range of motion. He exhibits no edema.  Neurological: He is alert and oriented to person, place, and time.  Skin: Skin is warm and dry.  Psychiatric: He has a normal mood and affect. His behavior is normal.  Nursing note and vitals reviewed.   ED Course  Procedures (including critical care time) Labs Review Labs Reviewed  CBC - Abnormal; Notable for the following:    WBC 15.0 (*)    All other components within normal limits  BASIC METABOLIC PANEL - Abnormal; Notable for the following:    Sodium 134 (*)    Glucose, Bld 146 (*)    Creatinine, Ser 1.49 (*)    GFR calc non Af Amer 51 (*)    GFR calc Af Amer 59 (*)    All other components within normal limits  URINALYSIS, ROUTINE W REFLEX MICROSCOPIC - Abnormal; Notable for the following:    Hgb urine dipstick MODERATE (*)    Ketones, ur 15 (*)    All other components within normal limits  URINE MICROSCOPIC-ADD ON - Abnormal; Notable for the following:    Bacteria, UA FEW (*)    All other components within normal limits  URINE CULTURE    Imaging Review Ct Abdomen Pelvis Wo Contrast  07/17/2014   CLINICAL DATA:  RIGHT flank pain, history of kidney stones  EXAM: CT ABDOMEN AND PELVIS WITHOUT CONTRAST  TECHNIQUE: Multidetector CT imaging of the abdomen and  pelvis was performed following the standard protocol without IV contrast. Oral contrast not administered for this indication. Sagittal and coronal MPR images reconstructed from axial data set.  COMPARISON:  None.  FINDINGS: Minimal dependent atelectasis at lung bases.  Multiple BILATERAL nonobstructing renal calculi.  In addition, RIGHT hydronephrosis and proximal hydroureter secondary to a 5 mm proximal RIGHT ureteral calculus image 41.  Mild perinephric and peripelvic edema RIGHT kidney.  Within limits of a nonenhanced exam no additional focal abnormalities of the kidneys, liver, spleen, pancreas, gallbladder or adrenal glands.  Stomach and bowel loops unremarkable.  Normal appendix.  Prior pelvic ventral hernia repair.  Small umbilical hernia containing fat.  Bladder unremarkable.  No mass, adenopathy, free air  or free fluid.  No acute osseous findings.  IMPRESSION: RIGHT hydronephrosis and proximal hydroureter secondary to a 5 mm proximal RIGHT ureteral calculus.  Multiple additional BILATERAL nonobstructing renal calculi.  Small umbilical hernia containing fat.   Electronically Signed   By: Ulyses Southward M.D.   On: 07/17/2014 21:49     EKG Interpretation None     8:45 PM Pt with R CVAT, n/v, hx of kidney stones, some requiring lithotripsy. CT w/o contrast pending to evaluate for obstructing stone. UA, labs pending.  12:05 PM CT results as shown above. Slight elevation on creatinine. I spoke with Dr. Marlou Porch, urologist on call to ensure follow up, who states this may pass on own, and to d/c home with flomax, pain meds, zofran and f/u in 2 weeks. Pain improved in ED. Stable for d/c. MDM   Final diagnoses:  Right ureteral stone  Hydronephrosis, right    Return precautions given. Patient states understanding of treatment care plan and is agreeable.  Kathrynn Speed, PA-C 07/18/14 0050  Arby Barrette, MD 07/18/14 [phone]

## 2014-07-18 MED ORDER — ONDANSETRON 4 MG PO TBDP: ORAL_TABLET | ORAL | Status: AC

## 2014-07-18 MED ORDER — OXYCODONE-ACETAMINOPHEN 5-325 MG PO TABS: 1.0000 | ORAL_TABLET | Freq: Four times a day (QID) | ORAL | Status: AC | PRN

## 2014-07-18 MED ORDER — TAMSULOSIN HCL 0.4 MG PO CAPS: 0.4000 mg | ORAL_CAPSULE | Freq: Every day | ORAL | Status: AC

## 2014-07-18 NOTE — Discharge Instructions (Signed)
Take percocet for severe pain only. No driving or operating heavy machinery while taking percocet. This medication may cause drowsiness. Take zofran as directed as needed for nausea. Take flomax daily. Strain all urine.  Ureteral Colic (Kidney Stones) Ureteral colic is the result of a condition when kidney stones form inside the kidney. Once kidney stones are formed they may move into the tube that connects the kidney with the bladder (ureter). If this occurs, this condition may cause pain (colic) in the ureter.  CAUSES  Pain is caused by stone movement in the ureter and the obstruction caused by the stone. SYMPTOMS  The pain comes and goes as the ureter contracts around the stone. The pain is usually intense, sharp, and stabbing in character. The location of the pain may move as the stone moves through the ureter. When the stone is near the kidney the pain is usually located in the back and radiates to the belly (abdomen). When the stone is ready to pass into the bladder the pain is often located in the lower abdomen on the side the stone is located. At this location, the symptoms may mimic those of a urinary tract infection with urinary frequency. Once the stone is located here it often passes into the bladder and the pain disappears completely. TREATMENT   Your caregiver will provide you with medicine for pain relief.  You may require specialized follow-up X-rays.  The absence of pain does not always mean that the stone has passed. It may have just stopped moving. If the urine remains completely obstructed, it can cause loss of kidney function or even complete destruction of the involved kidney. It is your responsibility and in your interest that X-rays and follow-ups as suggested by your caregiver are completed. Relief of pain without passage of the stone can be associated with severe damage to the kidney, including loss of kidney function on that side.  If your stone does not pass on its own,  additional measures may be taken by your caregiver to ensure its removal. HOME CARE INSTRUCTIONS   Increase your fluid intake. Water is the preferred fluid since juices containing vitamin C may acidify the urine making it less likely for certain stones (uric acid stones) to pass.  Strain all urine. A strainer will be provided. Keep all particulate matter or stones for your caregiver to inspect.  Take your pain medicine as directed.  Make a follow-up appointment with your caregiver as directed.  Remember that the goal is passage of your stone. The absence of pain does not mean the stone is gone. Follow your caregiver's instructions.  Only take over-the-counter or prescription medicines for pain, discomfort, or fever as directed by your caregiver. SEEK MEDICAL CARE IF:   Pain cannot be controlled with the prescribed medicine.  You have a fever.  Pain continues for longer than your caregiver advises it should.  There is a change in the pain, and you develop chest discomfort or constant abdominal pain.  You feel faint or pass out. MAKE SURE YOU:   Understand these instructions.  Will watch your condition.  Will get help right away if you are not doing well or get worse. Document Released: 02/01/2005 Document Revised: 08/19/2012 Document Reviewed: 10/19/2010 [facility] Patient Information 2015 [place], [place]. This information is not intended to replace advice given to you by your health care provider. Make sure you discuss any questions you have with your health care provider.  Kidney Stones Kidney stones (urolithiasis) are deposits that form  inside your kidneys. The intense pain is caused by the stone moving through the urinary tract. When the stone moves, the ureter goes into spasm around the stone. The stone is usually passed in the urine.  CAUSES   A disorder that makes certain neck glands produce too much parathyroid hormone (primary hyperparathyroidism).  A buildup of uric  acid crystals, similar to gout in your joints.  Narrowing (stricture) of the ureter.  A kidney obstruction present at birth (congenital obstruction).  Previous surgery on the kidney or ureters.  Numerous kidney infections. SYMPTOMS   Feeling sick to your stomach (nauseous).  Throwing up (vomiting).  Blood in the urine (hematuria).  Pain that usually spreads (radiates) to the groin.  Frequency or urgency of urination. DIAGNOSIS   Taking a history and physical exam.  Blood or urine tests.  CT scan.  Occasionally, an examination of the inside of the urinary bladder (cystoscopy) is performed. TREATMENT   Observation.  Increasing your fluid intake.  Extracorporeal shock wave lithotripsy--This is a noninvasive procedure that uses shock waves to break up kidney stones.  Surgery may be needed if you have severe pain or persistent obstruction. There are various surgical procedures. Most of the procedures are performed with the use of small instruments. Only small incisions are needed to accommodate these instruments, so recovery time is minimized. The size, location, and chemical composition are all important variables that will determine the proper choice of action for you. Talk to your health care provider to better understand your situation so that you will minimize the risk of injury to yourself and your kidney.  HOME CARE INSTRUCTIONS   Drink enough water and fluids to keep your urine clear or pale yellow. This will help you to pass the stone or stone fragments.  Strain all urine through the provided strainer. Keep all particulate matter and stones for your health care provider to see. The stone causing the pain may be as small as a grain of salt. It is very important to use the strainer each and every time you pass your urine. The collection of your stone will allow your health care provider to analyze it and verify that a stone has actually passed. The stone analysis will  often identify what you can do to reduce the incidence of recurrences.  Only take over-the-counter or prescription medicines for pain, discomfort, or fever as directed by your health care provider.  Make a follow-up appointment with your health care provider as directed.  Get follow-up X-rays if required. The absence of pain does not always mean that the stone has passed. It may have only stopped moving. If the urine remains completely obstructed, it can cause loss of kidney function or even complete destruction of the kidney. It is your responsibility to make sure X-rays and follow-ups are completed. Ultrasounds of the kidney can show blockages and the status of the kidney. Ultrasounds are not associated with any radiation and can be performed easily in a matter of minutes. SEEK MEDICAL CARE IF:  You experience pain that is progressive and unresponsive to any pain medicine you have been prescribed. SEEK IMMEDIATE MEDICAL CARE IF:   Pain cannot be controlled with the prescribed medicine.  You have a fever or shaking chills.  The severity or intensity of pain increases over 18 hours and is not relieved by pain medicine.  You develop a new onset of abdominal pain.  You feel faint or pass out.  You are unable to urinate. MAKE SURE  YOU:   Understand these instructions.  Will watch your condition.  Will get help right away if you are not doing well or get worse. Document Released: 04/24/2005 Document Revised: 12/25/2012 Document Reviewed: 09/25/2012 [facility] Patient Information 2015 [place], [place]. This information is not intended to replace advice given to you by your health care provider. Make sure you discuss any questions you have with your health care provider.

## 2014-07-19 LAB — URINE CULTURE
Colony Count: NO GROWTH
Culture: NO GROWTH

## 2015-08-26 IMAGING — CT CT ABD-PELV W/O CM
1 series · 15 of 32 positions shown, 19 images · non-contrast
Comparison: None.

CLINICAL DATA: RIGHT flank pain, history of kidney stones

EXAM:
CT ABDOMEN AND PELVIS WITHOUT CONTRAST
TECHNIQUE: Multidetector CT imaging of the abdomen and pelvis was performed
following the standard protocol without IV contrast. Oral contrast
not administered for this indication. Sagittal and coronal MPR
images reconstructed from axial data set.

[Series 6: lung · axial · 0.81mm/px · z∈[-217,-72]mm · 15 of 34 slices shown, 19 images]
[im 3/34  soft-tissue]
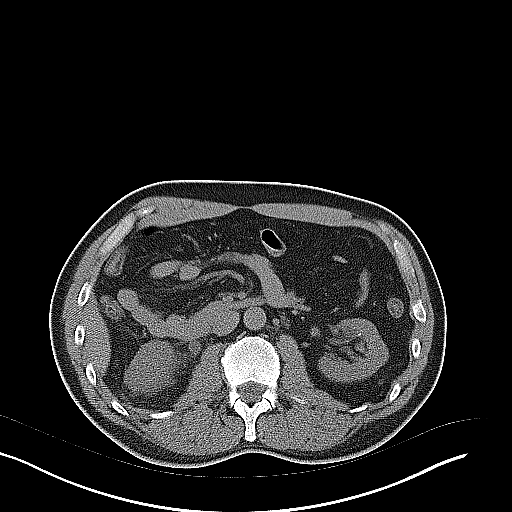
[im 3/34  bone]
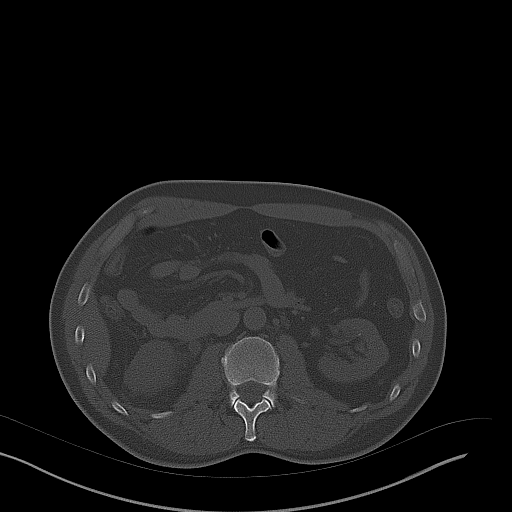
[im 5/34  soft-tissue]
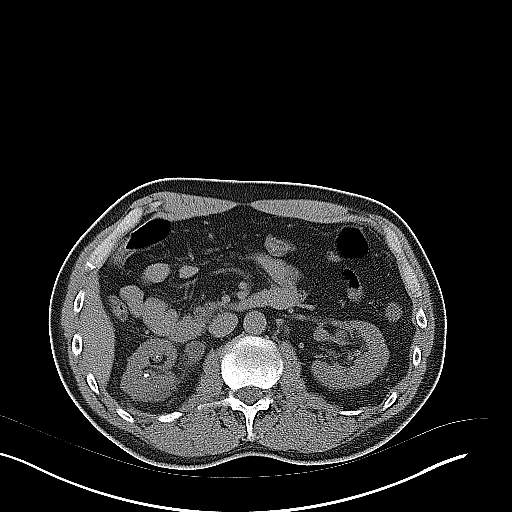
[im 7/34  soft-tissue]
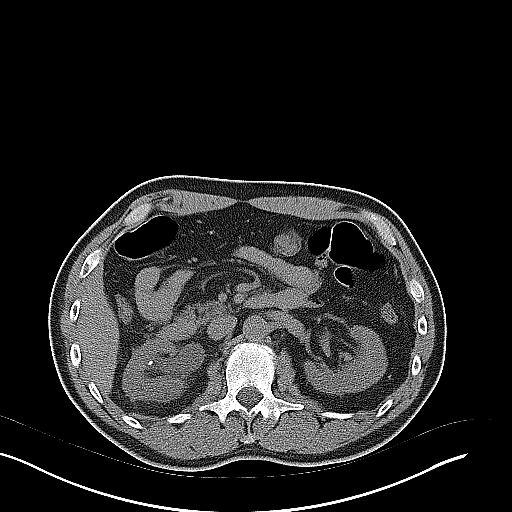
[im 10/34  soft-tissue]
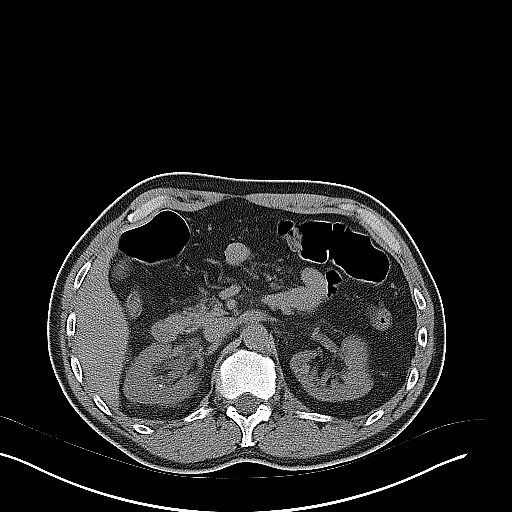
[im 12/34  soft-tissue]
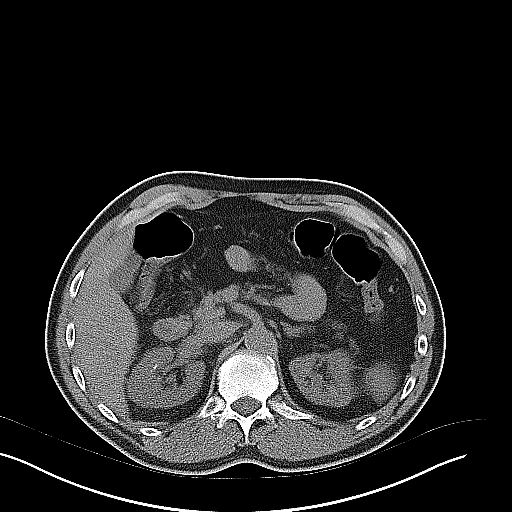
[im 14/34  soft-tissue]
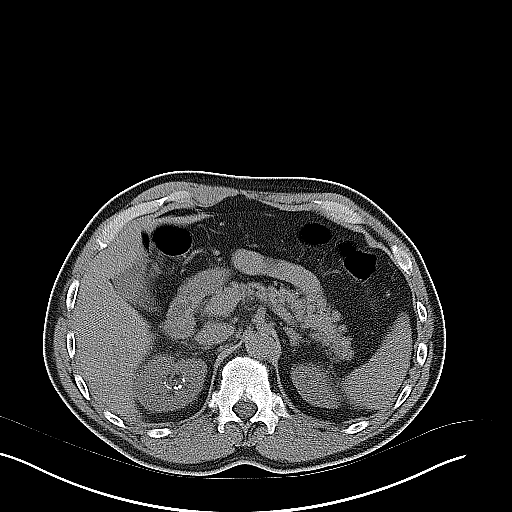
[im 18/34  soft-tissue]
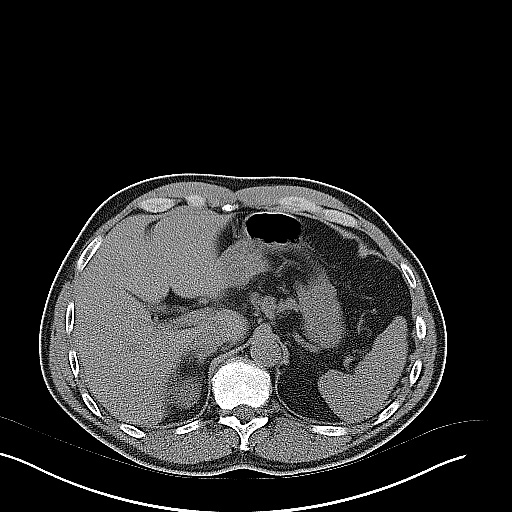
[im 20/34  soft-tissue]
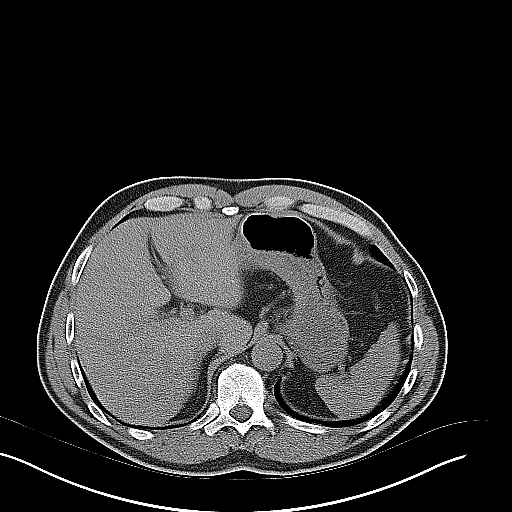
[im 22/34  soft-tissue]
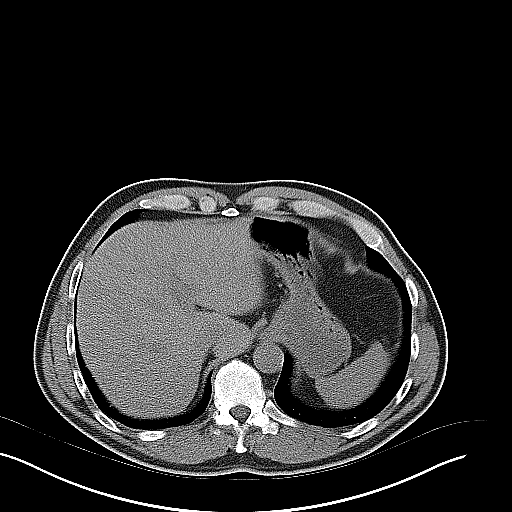
[im 22/34  bone]
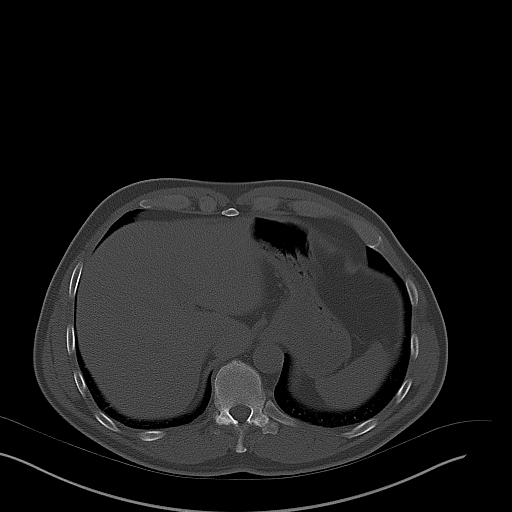
[im 24/34  soft-tissue]
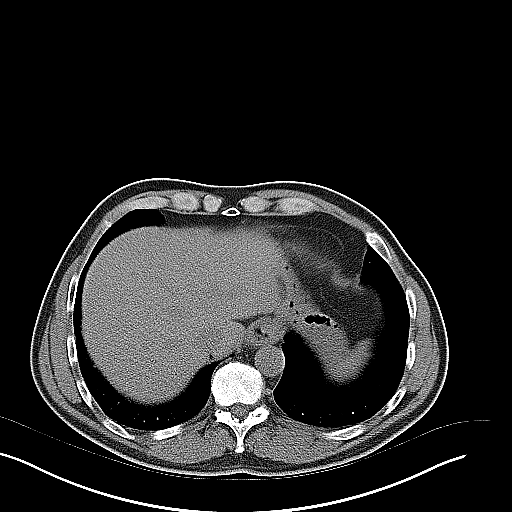
[im 27/34  soft-tissue]
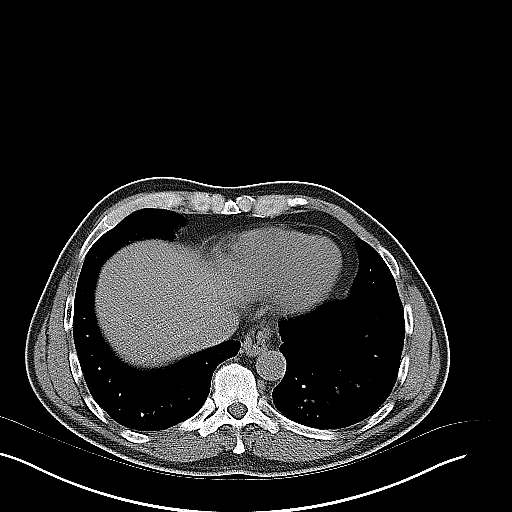
[im 29/34  soft-tissue]
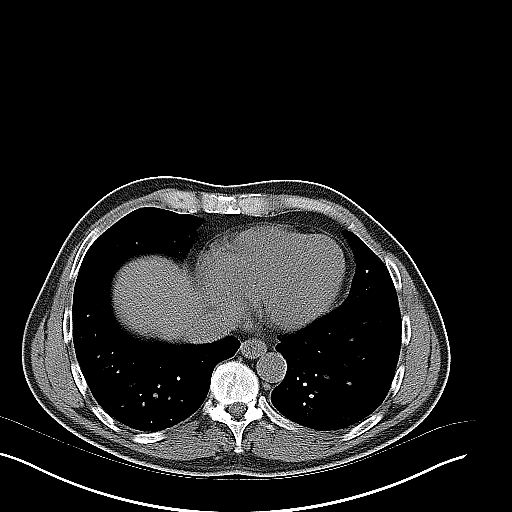
[im 29/34  lung]
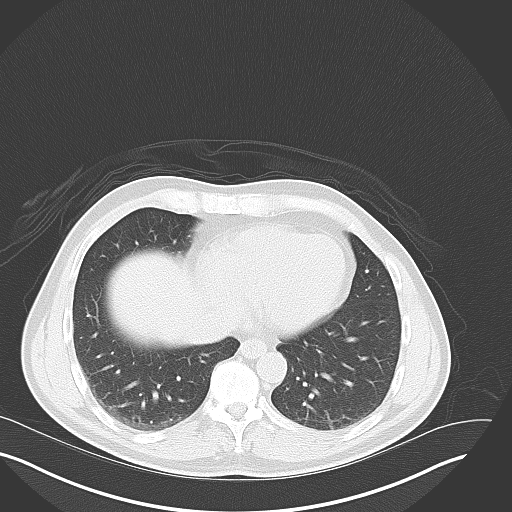
[im 30/34  lung]
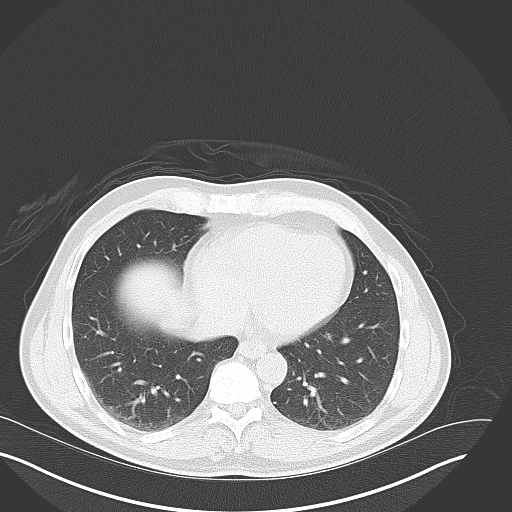
[im 31/34  soft-tissue]
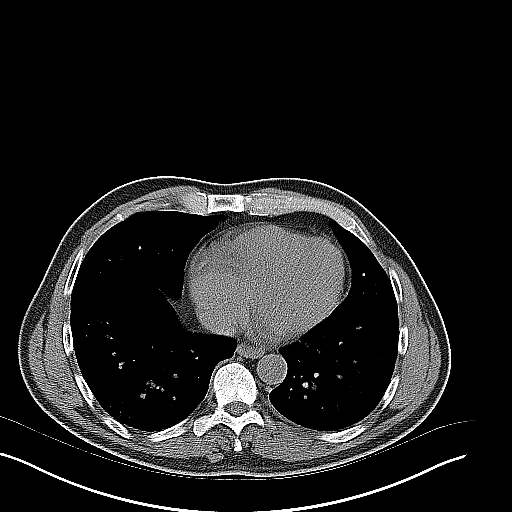
[im 31/34  lung]
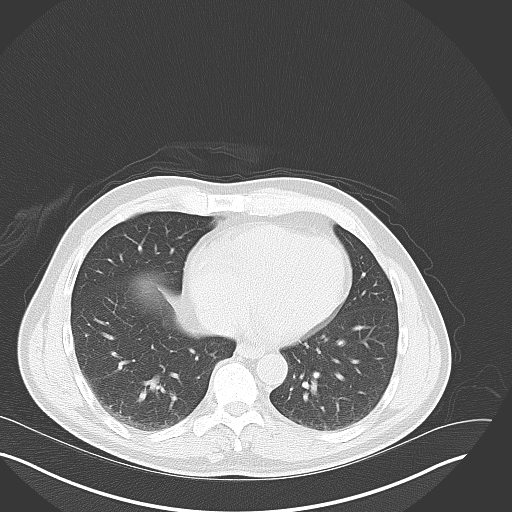
[im 32/34  lung]
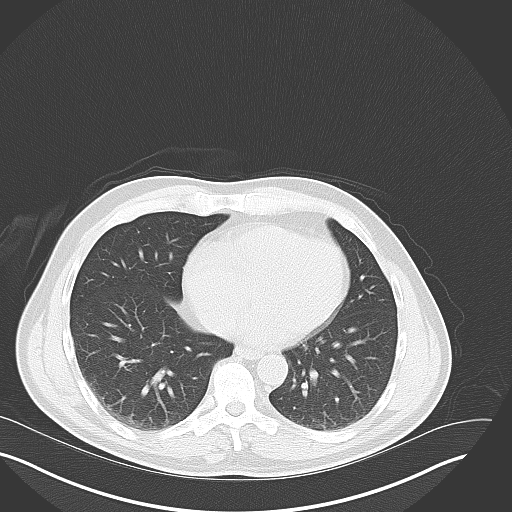

[15 of 32 positions shown; findings below may reference images not displayed]

FINDINGS: Minimal dependent atelectasis at lung bases.

Multiple BILATERAL nonobstructing renal calculi.

In addition, RIGHT hydronephrosis and proximal hydroureter secondary
to a 5 mm proximal RIGHT ureteral calculus image 41.

Mild perinephric and peripelvic edema RIGHT kidney.

Within limits of a nonenhanced exam no additional focal
abnormalities of the kidneys, liver, spleen, pancreas, gallbladder
or adrenal glands.

Stomach and bowel loops unremarkable.

Normal appendix.

Prior pelvic ventral hernia repair.

Small umbilical hernia containing fat.

Bladder unremarkable.

No mass, adenopathy, free air or free fluid.

No acute osseous findings.
IMPRESSION: RIGHT hydronephrosis and proximal hydroureter secondary to a 5 mm
proximal RIGHT ureteral calculus.

Multiple additional BILATERAL nonobstructing renal calculi.

Small umbilical hernia containing fat.

## 2021-02-16 ENCOUNTER — Other Ambulatory Visit: Payer: Self-pay | Admitting: Internal Medicine

## 2021-02-16 DIAGNOSIS — E7800 Pure hypercholesterolemia, unspecified: Secondary | ICD-10-CM

## 2022-02-22 ENCOUNTER — Other Ambulatory Visit (HOSPITAL_BASED_OUTPATIENT_CLINIC_OR_DEPARTMENT_OTHER): Payer: Self-pay | Admitting: Internal Medicine

## 2022-02-22 DIAGNOSIS — Z8249 Family history of ischemic heart disease and other diseases of the circulatory system: Secondary | ICD-10-CM

## 2023-03-01 ENCOUNTER — Other Ambulatory Visit (HOSPITAL_BASED_OUTPATIENT_CLINIC_OR_DEPARTMENT_OTHER): Payer: Self-pay | Admitting: Internal Medicine

## 2023-03-01 DIAGNOSIS — E7800 Pure hypercholesterolemia, unspecified: Secondary | ICD-10-CM

## 2023-03-13 ENCOUNTER — Ambulatory Visit (HOSPITAL_COMMUNITY)
Admission: RE | Admit: 2023-03-13 | Discharge: 2023-03-13 | Disposition: A | Discharge status: Home / Self Care | Payer: Self-pay | Source: Ambulatory Visit | Attending: Internal Medicine | Admitting: Internal Medicine

## 2023-03-13 DIAGNOSIS — E7800 Pure hypercholesterolemia, unspecified: Secondary | ICD-10-CM | POA: Insufficient documentation

## 2024-03-25 ENCOUNTER — Other Ambulatory Visit: Payer: Self-pay | Admitting: General Surgery

## 2024-03-25 DIAGNOSIS — R1909 Other intra-abdominal and pelvic swelling, mass and lump: Secondary | ICD-10-CM

## 2024-03-28 ENCOUNTER — Ambulatory Visit
Admission: RE | Admit: 2024-03-28 | Discharge: 2024-03-28 | Disposition: A | Discharge status: Home / Self Care | Source: Ambulatory Visit | Attending: General Surgery | Admitting: General Surgery

## 2024-03-28 DIAGNOSIS — R1909 Other intra-abdominal and pelvic swelling, mass and lump: Secondary | ICD-10-CM

## 2024-03-28 MED ORDER — IOPAMIDOL (ISOVUE-370) INJECTION 76%
75.0000 mL | Freq: Once | INTRAVENOUS | Status: AC | PRN
  Administered 2024-03-28: 75 mL via INTRAVENOUS

## 2024-04-15 ENCOUNTER — Ambulatory Visit: Payer: Self-pay | Admitting: General Surgery
